# Patient Record
Sex: Female | Born: 1970 | Race: White | Hispanic: No | Marital: Single | Smoking: Never smoker
Health system: Southern US, Community
[De-identification: ages and names within clinical notes are randomized; demographics above are authoritative.]

## PROBLEM LIST (undated history)

## (undated) HISTORY — PX: ABDOMINAL HYSTERECTOMY: SHX81

---

## 2017-12-03 ENCOUNTER — Other Ambulatory Visit: Payer: Self-pay

## 2017-12-03 ENCOUNTER — Emergency Department (HOSPITAL_COMMUNITY): Payer: Self-pay

## 2017-12-03 ENCOUNTER — Emergency Department (HOSPITAL_COMMUNITY)
Admission: EM | Admit: 2017-12-03 | Discharge: 2017-12-03 | Disposition: A | Discharge status: Home / Self Care | Payer: Self-pay | Attending: Emergency Medicine | Admitting: Emergency Medicine

## 2017-12-03 ENCOUNTER — Encounter (HOSPITAL_COMMUNITY): Payer: Self-pay | Admitting: Emergency Medicine

## 2017-12-03 DIAGNOSIS — H9201 Otalgia, right ear: Secondary | ICD-10-CM | POA: Insufficient documentation

## 2017-12-03 DIAGNOSIS — R6884 Jaw pain: Secondary | ICD-10-CM | POA: Insufficient documentation

## 2017-12-03 DIAGNOSIS — M79621 Pain in right upper arm: Secondary | ICD-10-CM | POA: Insufficient documentation

## 2017-12-03 DIAGNOSIS — Z5321 Procedure and treatment not carried out due to patient leaving prior to being seen by health care provider: Secondary | ICD-10-CM | POA: Insufficient documentation

## 2017-12-03 LAB — I-STAT BETA HCG BLOOD, ED (MC, WL, AP ONLY): I-stat hCG, quantitative: <5 m[IU]/mL (ref <5)

## 2017-12-03 LAB — CBC
HCT: 41.4 % (ref 36.0–46.0)
Hemoglobin: 12.9 g/dL (ref 12.0–15.0)
MCH: 27.6 pg (ref 26.0–34.0)
MCHC: 31.2 g/dL (ref 30.0–36.0)
MCV: 88.7 fL (ref 80.0–100.0)
PLATELETS: 325 10*3/uL (ref 150–400)
RBC: 4.67 MIL/uL (ref 3.87–5.11)
RDW: 13.4 % (ref 11.5–15.5)
WBC: 8 10*3/uL (ref 4.0–10.5)
nRBC: 0 % (ref 0.0–0.2)

## 2017-12-03 LAB — BASIC METABOLIC PANEL
Anion gap: 7 (ref 5–15)
BUN: 14 mg/dL (ref 6–20)
CO2: 27 mmol/L (ref 22–32)
CREATININE: 0.66 mg/dL (ref 0.44–1.00)
Calcium: 9.5 mg/dL (ref 8.9–10.3)
Chloride: 105 mmol/L (ref 98–111)
GFR calc Af Amer: >60 mL/min (ref >60)
GLUCOSE: 106 mg/dL — AB (ref 70–99)
Potassium: 4.1 mmol/L (ref 3.5–5.1)
SODIUM: 139 mmol/L (ref 135–145)

## 2017-12-03 LAB — I-STAT TROPONIN, ED: TROPONIN I, POC: 0 ng/mL (ref 0.00–0.08)

## 2017-12-03 NOTE — ED Notes (Signed)
Pt gave stickers back to registration and left AMA 

## 2017-12-03 NOTE — ED Triage Notes (Signed)
Pt reports that she been having intermittent shoulder pains, right arm pain, jaw pain, right ear pains. Was told by co-worker that she has swelling on her shoulder blade in back and collar bone in front.

## 2019-05-03 IMAGING — CR DG CHEST 2V
2 series · 2 of 2 positions shown · non-contrast
Comparison: None.

CLINICAL DATA: Shoulder pain.

EXAM:
CHEST - 2 VIEW

[w chest pa]
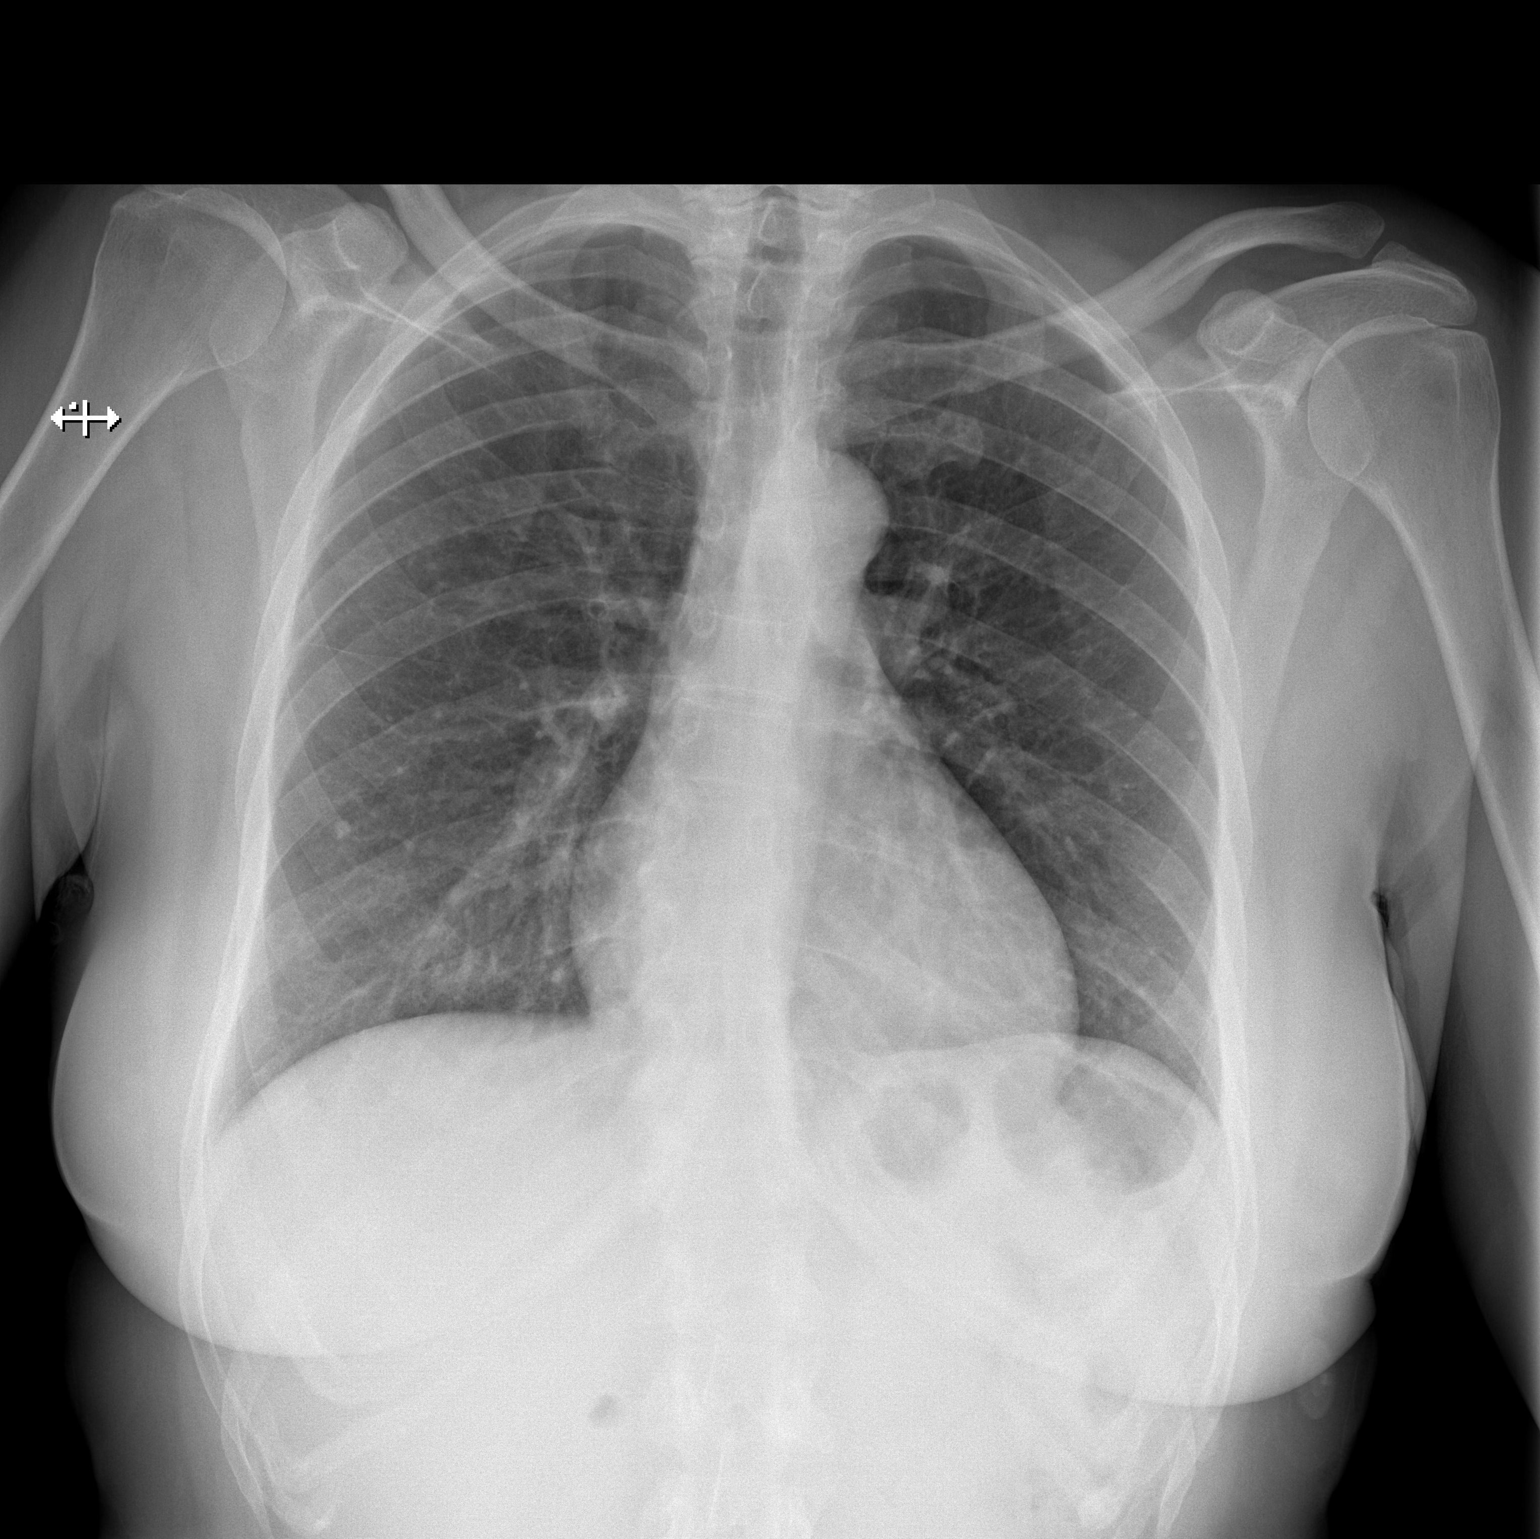

[w chest lat]
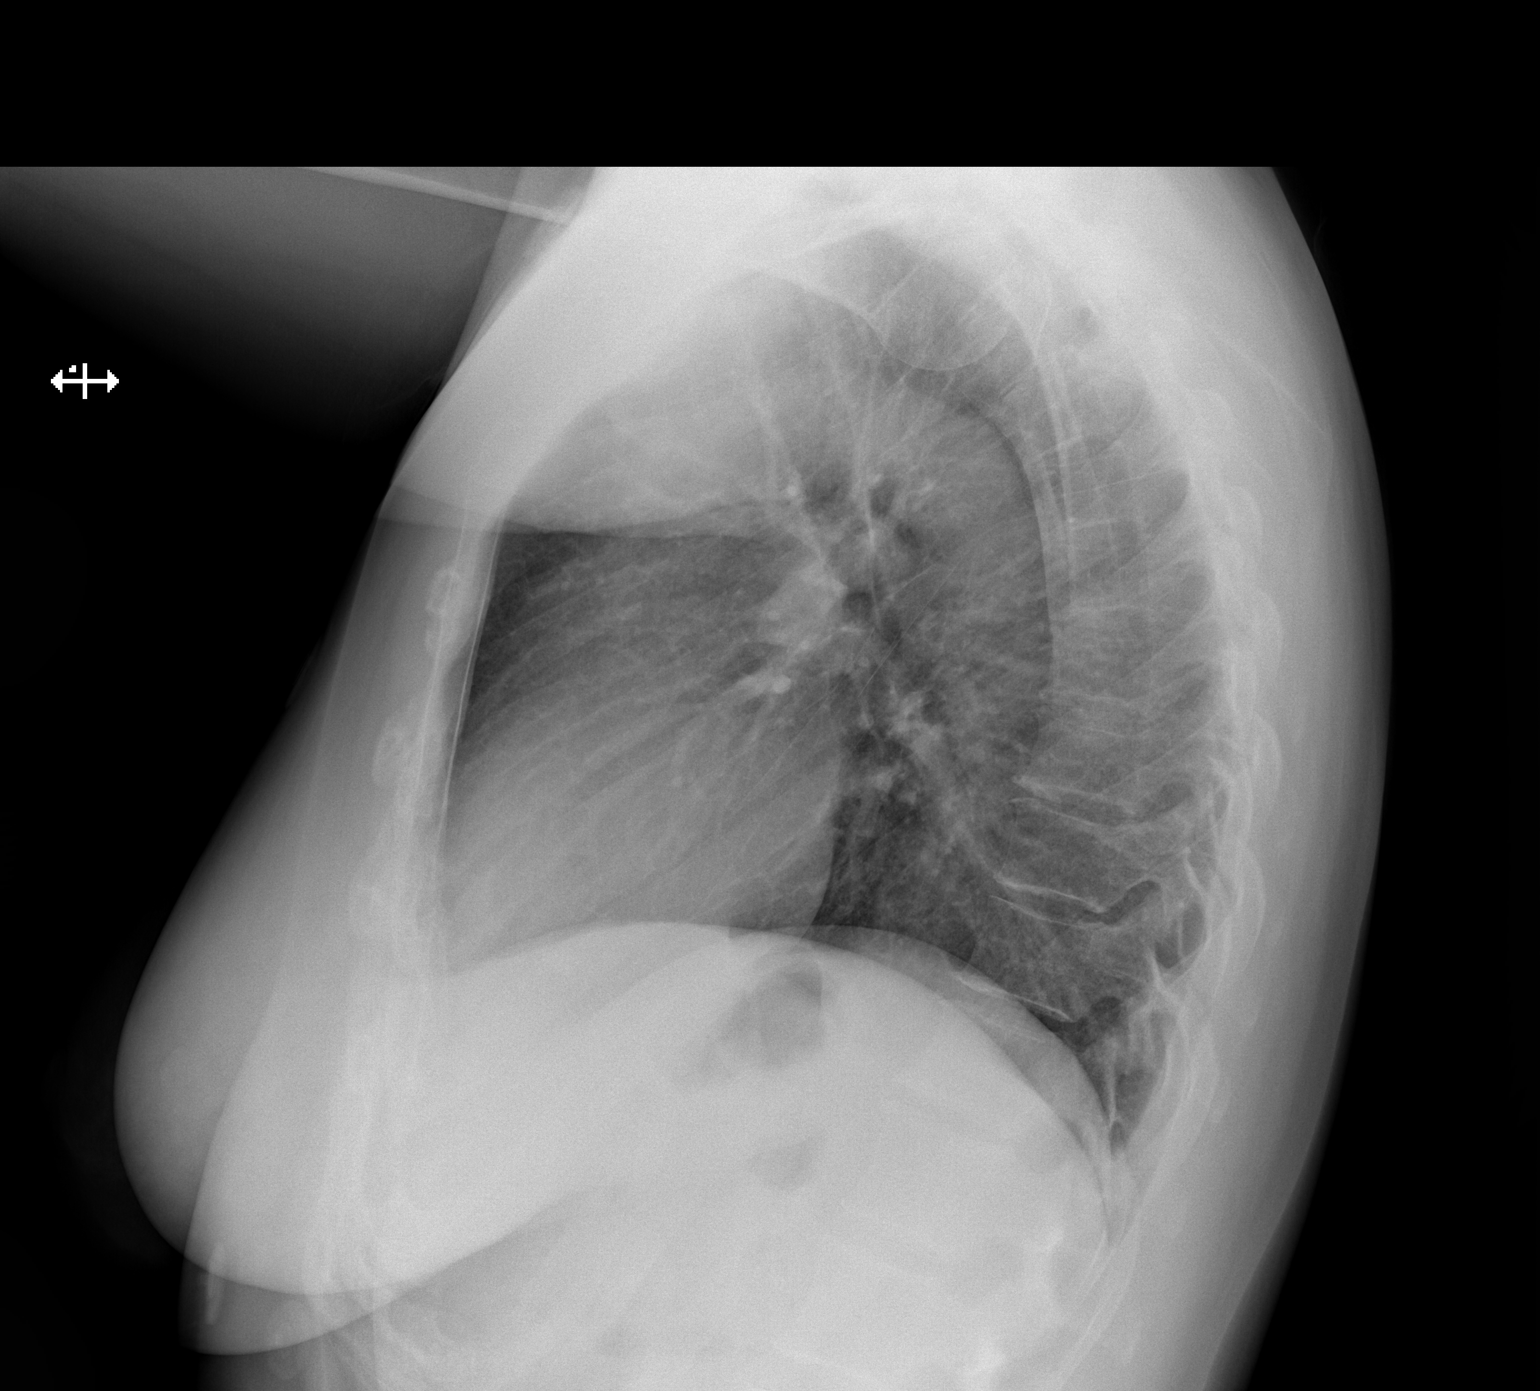

[2 of 2 positions shown; findings below may reference images not displayed]

FINDINGS: The heart size and mediastinal contours are within normal limits.
Both lungs are clear. The visualized skeletal structures are
unremarkable.
IMPRESSION: No active cardiopulmonary disease.
